# Patient Record
Sex: Male | Born: 1985 | Race: Black or African American | Hispanic: No | Marital: Married | Smoking: Never smoker
Health system: Southern US, Community
[De-identification: ages and names within clinical notes are randomized; demographics above are authoritative.]

## PROBLEM LIST (undated history)

## (undated) DIAGNOSIS — J45909 Unspecified asthma, uncomplicated: Secondary | ICD-10-CM

---
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
## 2005-05-03 ENCOUNTER — Emergency Department (HOSPITAL_COMMUNITY): Admission: EM | Admit: 2005-05-03 | Discharge: 2005-05-03 | Payer: Self-pay | Admitting: Emergency Medicine

## 2009-11-20 ENCOUNTER — Emergency Department (HOSPITAL_COMMUNITY): Admission: EM | Admit: 2009-11-20 | Discharge: 2009-11-21 | Payer: Self-pay | Admitting: Emergency Medicine

## 2009-12-03 ENCOUNTER — Emergency Department (HOSPITAL_COMMUNITY): Admission: EM | Admit: 2009-12-03 | Discharge: 2009-12-03 | Payer: Self-pay | Admitting: Emergency Medicine

## 2009-12-05 ENCOUNTER — Emergency Department (HOSPITAL_COMMUNITY): Admission: EM | Admit: 2009-12-05 | Discharge: 2009-12-05 | Payer: Self-pay | Admitting: Emergency Medicine

## 2011-01-08 NOTE — Op Note (Signed)
NAME:  Patrick Sanford, Patrick Sanford                    ACCOUNT NO.:  [id number]   MEDICAL RECORD NO.:  [id number]                   PATIENT TYPE:  INP   LOCATION:  5039                                 FACILITY:  [facility]   PHYSICIAN:  Hinton Dyer, D.D.S.            DATE OF BIRTH:  02-Apr-1986   DATE OF PROCEDURE:  06/23/2003  DATE OF DISCHARGE:                                 OPERATIVE REPORT   PREOPERATIVE DIAGNOSES:  1. Fracture of right angle of mandible.  2. Fracture of left parasymphysis of mandible.  3. Multiple fractured teeth.  4. Nonrestorable teeth #6 and 12.   POSTOPERATIVE DIAGNOSES:  1. Fracture of right angle of mandible.  2. Fracture of left parasymphysis of mandible.  3. Multiple fractured teeth.  4. Nonrestorable teeth #6 and #12.   PROCEDURES:  1. Surgical closed reduction of right angle of mandible.  2. Closed reduction of left parasymphysis of mandible.  3. Surgical extraction of teeth #6 and 12.  4. Application of Erich arch bars with intramaxillary fixation.   ANESTHESIA:  General.   SURGEON:  Hinton Dyer, D.D.S.   ESTIMATED BLOOD LOSS:  30-40 mL.   CONDITION FOLLOWING SURGERY:  Good.   DESCRIPTION OF PROCEDURE:  Following preoperative medication, the patient  was brought to the operating room in the supine position where he remained  throughout the whole procedure.  He was intubated by nasal endotracheal tube  and then prepped and draped in the usual fashion for an intraoral procedure.  The facial bandages over the laceration, which had been sutured in the  emergency room, were removed and the face was scrubbed with Betadine scrub  and paint.  The maxillary arch extending from teeth #3-14 was measured with  a measuring wire and then an [place] bar was cut to fit to the area.  Tooth #6  was found to be fractured below the alveolus and a buccal flap was created.  Some bone was removed with the round bur and copiously irrigated.  The tooth  was  mobilized with an 11A elevator and the root was removed.  The socket was  smoothed off and then closed primarily with multiple 3-0 chromic sutures.  Tooth #12 was found to be split in a mesial distal fashion and  nonrestorable.  So it was removed at the same time.  The a buccal flap was  created.  Buccal bone was removed with the round bur.  After copious  irrigation, the roots of the tooth were removed.  The socket was curetted  and closed with a 3-0 chromic suture.  Teeth #18, 19, and 26 were found to  be fractured.  Several other teeth were fracture with cusps missing and  buccal aspect of the tooth fractured off.  Teeth #23, 24, and 25 were also  found to be moderately mobile.  The arch bar was fitting to the maxillary  arch and 24 gauge stainless steel wires were passed  around teeth #3, 4, 5,  7, 8, 9, 10, 11, 13, and 14 and around the [place] arch bar and then turned in  a clockwise fashion in the mucobuccal fold.  An Erich arch bar was fitted to  teeth #19-30 and 24 gauge stainless steel wires were passed around teeth  #19, 20, 21, 22, 23, 24, 25, 26, 27, 28, 29, and 30 and turned in a  clockwise fashion into the mucobuccal fold.  The patient has a class III  malocclusion and was told this ahead of time and he was fit together in the  most appropriate fashion after rinsing of the mouth and removing the throat  pack.  Then 24 gauge stainless steel wires were used to connect the  maxillary arch bar to the mandibular arch bar and five wires were used.  The  patient was then extubated on the table and returned to the recovery room in  good condition.  He was given postoperative instructions ahead of time to  both himself and his mother.  Everything was given to him written down.  He  is to carry a wire cutter with him at all times.  He was given a  prescription for Lortab elixir x 300 mL and Keflex elixir x 300 mL, 250/5 mL  teaspoon.  He will be followed by me in my private office and  postoperative  x-rays will be taken at that time.                                               Hinton Dyer, D.D.S.    Lynden Ang  D:  06/23/2003  T:  06/23/2003  Job:  [id number]

## 2018-03-02 ENCOUNTER — Emergency Department (HOSPITAL_COMMUNITY)
Admission: EM | Admit: 2018-03-02 | Discharge: 2018-03-02 | Disposition: A | Discharge status: Home / Self Care | Payer: Self-pay | Attending: Emergency Medicine | Admitting: Emergency Medicine

## 2018-03-02 ENCOUNTER — Other Ambulatory Visit: Payer: Self-pay

## 2018-03-02 ENCOUNTER — Emergency Department (HOSPITAL_COMMUNITY): Payer: Self-pay

## 2018-03-02 ENCOUNTER — Encounter (HOSPITAL_COMMUNITY): Payer: Self-pay | Admitting: *Deleted

## 2018-03-02 DIAGNOSIS — Y92410 Unspecified street and highway as the place of occurrence of the external cause: Secondary | ICD-10-CM | POA: Insufficient documentation

## 2018-03-02 DIAGNOSIS — R079 Chest pain, unspecified: Secondary | ICD-10-CM | POA: Insufficient documentation

## 2018-03-02 DIAGNOSIS — Y998 Other external cause status: Secondary | ICD-10-CM | POA: Insufficient documentation

## 2018-03-02 DIAGNOSIS — S060X0A Concussion without loss of consciousness, initial encounter: Secondary | ICD-10-CM | POA: Insufficient documentation

## 2018-03-02 DIAGNOSIS — Y9389 Activity, other specified: Secondary | ICD-10-CM | POA: Insufficient documentation

## 2018-03-02 HISTORY — DX: Unspecified asthma, uncomplicated: J45.909

## 2018-03-02 MED ORDER — BUTALBITAL-APAP-CAFFEINE 50-325-40 MG PO TABS
1.0000 | ORAL_TABLET | Freq: Once | ORAL | Status: AC
  Administered 2018-03-02: 1 via ORAL
  Filled 2018-03-02: qty 1

## 2018-03-02 MED ORDER — BUTALBITAL-APAP-CAFFEINE 50-325-40 MG PO TABS
2.0000 | ORAL_TABLET | Freq: Once | ORAL | Status: AC
  Administered 2018-03-02: 1 via ORAL
  Filled 2018-03-02: qty 2

## 2018-03-02 MED ORDER — IBUPROFEN 800 MG PO TABS
800.0000 mg | ORAL_TABLET | Freq: Once | ORAL | Status: AC
Start: 2018-03-02 — End: 2018-03-02
  Administered 2018-03-02: 800 mg via ORAL
  Filled 2018-03-02: qty 1
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
## 2018-03-02 NOTE — Discharge Instructions (Addendum)
Take 4 over the counter ibuprofen tablets 3 times a day or 2 over-the-counter naproxen tablets twice a day for pain. Also take tylenol 1000mg (2 extra strength) four times a day.  Do not take either of these to stop a headache. Follow up with your PCP.   I have attached the concussion clinic information in the paperwork if you need.

## 2018-03-03 ENCOUNTER — Other Ambulatory Visit: Payer: Self-pay

## 2018-03-03 ENCOUNTER — Emergency Department (HOSPITAL_COMMUNITY)
Admission: EM | Admit: 2018-03-03 | Discharge: 2018-03-03 | Disposition: A | Discharge status: Home / Self Care | Payer: Self-pay | Attending: Emergency Medicine | Admitting: Emergency Medicine

## 2018-03-03 ENCOUNTER — Emergency Department (HOSPITAL_COMMUNITY): Payer: Self-pay

## 2018-03-03 ENCOUNTER — Encounter (HOSPITAL_COMMUNITY): Payer: Self-pay | Admitting: Emergency Medicine

## 2018-03-03 DIAGNOSIS — J45909 Unspecified asthma, uncomplicated: Secondary | ICD-10-CM | POA: Insufficient documentation

## 2018-03-03 DIAGNOSIS — Y998 Other external cause status: Secondary | ICD-10-CM | POA: Insufficient documentation

## 2018-03-03 DIAGNOSIS — W3400XA Accidental discharge from unspecified firearms or gun, initial encounter: Secondary | ICD-10-CM

## 2018-03-03 DIAGNOSIS — Y9389 Activity, other specified: Secondary | ICD-10-CM | POA: Insufficient documentation

## 2018-03-03 DIAGNOSIS — Y92830 Public park as the place of occurrence of the external cause: Secondary | ICD-10-CM | POA: Insufficient documentation

## 2018-03-03 DIAGNOSIS — Z23 Encounter for immunization: Secondary | ICD-10-CM | POA: Insufficient documentation

## 2018-03-03 DIAGNOSIS — S71102A Unspecified open wound, left thigh, initial encounter: Secondary | ICD-10-CM | POA: Insufficient documentation

## 2018-03-03 LAB — I-STAT CHEM 8, ED
BUN: 17 mg/dL (ref 6–20)
Calcium, Ion: 1.1 mmol/L — ABNORMAL LOW (ref 1.15–1.40)
Chloride: 105 mmol/L (ref 98–111)
Creatinine, Ser: 1.2 mg/dL (ref 0.61–1.24)
Glucose, Bld: 104 mg/dL — ABNORMAL HIGH (ref 70–99)
HCT: 47 % (ref 39.0–52.0)
Hemoglobin: 16 g/dL (ref 13.0–17.0)
Potassium: 3.8 mmol/L (ref 3.5–5.1)
Sodium: 141 mmol/L (ref 135–145)
TCO2: 22 mmol/L (ref 22–32)

## 2018-03-03 LAB — COMPREHENSIVE METABOLIC PANEL
ALT: 26 U/L (ref 0–44)
AST: 29 U/L (ref 15–41)
Albumin: 4.7 g/dL (ref 3.5–5.0)
Alkaline Phosphatase: 40 U/L (ref 38–126)
Anion gap: 14 (ref 5–15)
BUN: 15 mg/dL (ref 6–20)
CO2: 21 mmol/L — ABNORMAL LOW (ref 22–32)
Calcium: 9.1 mg/dL (ref 8.9–10.3)
Chloride: 106 mmol/L (ref 98–111)
Creatinine, Ser: 1.16 mg/dL (ref 0.61–1.24)
GFR calc Af Amer: >60 mL/min (ref >60)
GFR calc non Af Amer: >60 mL/min (ref >60)
Glucose, Bld: 104 mg/dL — ABNORMAL HIGH (ref 70–99)
Potassium: 3.7 mmol/L (ref 3.5–5.1)
Sodium: 141 mmol/L (ref 135–145)
Total Bilirubin: 1 mg/dL (ref 0.3–1.2)
Total Protein: 7.2 g/dL (ref 6.5–8.1)

## 2018-03-03 LAB — CBC
HCT: 46 % (ref 39.0–52.0)
Hemoglobin: 14.8 g/dL (ref 13.0–17.0)
MCH: 28.7 pg (ref 26.0–34.0)
MCHC: 32.2 g/dL (ref 30.0–36.0)
MCV: 89.3 fL (ref 78.0–100.0)
Platelets: 275 10*3/uL (ref 150–400)
RBC: 5.15 MIL/uL (ref 4.22–5.81)
RDW: 13 % (ref 11.5–15.5)
WBC: 8.7 10*3/uL (ref 4.0–10.5)

## 2018-03-03 LAB — I-STAT CG4 LACTIC ACID, ED: Lactic Acid, Venous: 2.62 mmol/L (ref 0.5–1.9)

## 2018-03-03 LAB — PROTIME-INR
INR: 1.05
Prothrombin Time: 13.6 seconds (ref 11.4–15.2)

## 2018-03-03 LAB — ETHANOL: Alcohol, Ethyl (B): 156 mg/dL — ABNORMAL HIGH (ref <10)

## 2018-03-03 LAB — CDS SEROLOGY

## 2018-03-03 LAB — SAMPLE TO BLOOD BANK

## 2018-03-03 MED ORDER — IBUPROFEN 400 MG PO TABS: 400.0000 mg | ORAL_TABLET | Freq: Three times a day (TID) | ORAL | 0 refills | Status: AC | PRN

## 2018-03-03 MED ORDER — TETANUS-DIPHTH-ACELL PERTUSSIS 5-2.5-18.5 LF-MCG/0.5 IM SUSP
0.5000 mL | Freq: Once | INTRAMUSCULAR | Status: AC
  Administered 2018-03-03: 0.5 mL via INTRAMUSCULAR
  Filled 2018-03-03: qty 0.5

## 2018-03-03 MED ORDER — SODIUM CHLORIDE 0.9 % IV BOLUS (SEPSIS)
1000.0000 mL | Freq: Once | INTRAVENOUS | Status: AC
  Administered 2018-03-03: 1000 mL via INTRAVENOUS

## 2018-03-03 NOTE — ED Notes (Signed)
Pt departed in NAD, refused use of wheelchair.  
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
## 2023-07-13 ENCOUNTER — Emergency Department (HOSPITAL_COMMUNITY)
Admission: EM | Admit: 2023-07-13 | Discharge: 2023-07-14 | Disposition: A | Discharge status: Home / Self Care | Payer: Self-pay | Attending: Emergency Medicine | Admitting: Emergency Medicine

## 2023-07-13 ENCOUNTER — Encounter (HOSPITAL_COMMUNITY): Payer: Self-pay

## 2023-07-13 ENCOUNTER — Other Ambulatory Visit: Payer: Self-pay

## 2023-07-13 ENCOUNTER — Emergency Department (HOSPITAL_COMMUNITY): Payer: Self-pay

## 2023-07-13 DIAGNOSIS — Z23 Encounter for immunization: Secondary | ICD-10-CM | POA: Insufficient documentation

## 2023-07-13 DIAGNOSIS — J45909 Unspecified asthma, uncomplicated: Secondary | ICD-10-CM | POA: Insufficient documentation

## 2023-07-13 DIAGNOSIS — S0990XA Unspecified injury of head, initial encounter: Secondary | ICD-10-CM

## 2023-07-13 DIAGNOSIS — S0181XA Laceration without foreign body of other part of head, initial encounter: Secondary | ICD-10-CM | POA: Insufficient documentation

## 2023-07-13 DIAGNOSIS — S0101XA Laceration without foreign body of scalp, initial encounter: Secondary | ICD-10-CM | POA: Insufficient documentation

## 2023-07-13 MED ORDER — TETANUS-DIPHTH-ACELL PERTUSSIS 5-2.5-18.5 LF-MCG/0.5 IM SUSY
0.5000 mL | PREFILLED_SYRINGE | Freq: Once | INTRAMUSCULAR | Status: AC
  Administered 2023-07-13: 0.5 mL via INTRAMUSCULAR
  Filled 2023-07-13: qty 0.5

## 2023-07-13 MED ORDER — OXYCODONE-ACETAMINOPHEN 5-325 MG PO TABS
1.0000 | ORAL_TABLET | Freq: Once | ORAL | Status: AC
  Administered 2023-07-13: 1 via ORAL
  Filled 2023-07-13: qty 1

## 2023-07-13 MED ORDER — LIDOCAINE HCL (PF) 1 % IJ SOLN
5.0000 mL | Freq: Once | INTRAMUSCULAR | Status: AC
  Administered 2023-07-13: 5 mL
  Filled 2023-07-13: qty 30
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
## 2023-07-13 NOTE — Discharge Instructions (Addendum)
We evaluated you for your head injury. Your CT scan was negative.   Please take Tylenol and Motrin for your symptoms at home.  You can take 1000 mg of Tylenol every 6 hours and 600 mg of ibuprofen every 6 hours as needed for your symptoms.  You can take these medicines together as needed, either at the same time, or alternating every 3 hours.  Please have your staples removed in 10-14 days with your primary physician, urgent care, or ER.   You may have a concussion.  This can cause you to have some headaches, dizziness and sensitivity to light.  If you notice any specific activities make your symptoms worse like reading small text or watching television, try to avoid these activities.
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
## 2023-07-27 ENCOUNTER — Ambulatory Visit (HOSPITAL_COMMUNITY)
Admission: RE | Admit: 2023-07-27 | Discharge: 2023-07-27 | Disposition: A | Discharge status: Home / Self Care | Payer: Self-pay | Source: Ambulatory Visit

## 2023-07-27 ENCOUNTER — Encounter (HOSPITAL_COMMUNITY): Payer: Self-pay

## 2023-07-27 DIAGNOSIS — Z4802 Encounter for removal of sutures: Secondary | ICD-10-CM
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
## 2023-07-27 NOTE — ED Triage Notes (Signed)
Patient to UC for removal of staples. Patient denies needing to see provider and has no other complaints. 3 staples removed from top of patient's head. Patient tolerated well.
